# Patient Record
Sex: Female | Born: 1955
Health system: Southern US, Community
[De-identification: ages and names within clinical notes are randomized; demographics above are authoritative.]

## PROBLEM LIST (undated history)

## (undated) DIAGNOSIS — H409 Unspecified glaucoma: Secondary | ICD-10-CM

## (undated) DIAGNOSIS — I10 Essential (primary) hypertension: Secondary | ICD-10-CM

## (undated) HISTORY — PX: TUBAL LIGATION: SHX77

---

## 2001-06-06 ENCOUNTER — Other Ambulatory Visit: Admission: RE | Admit: 2001-06-06 | Discharge: 2001-06-06 | Payer: Self-pay | Admitting: Family Medicine

## 2005-10-03 ENCOUNTER — Other Ambulatory Visit: Admission: RE | Admit: 2005-10-03 | Discharge: 2005-10-03 | Payer: Self-pay | Admitting: Family Medicine

## 2009-01-06 ENCOUNTER — Other Ambulatory Visit: Admission: RE | Admit: 2009-01-06 | Discharge: 2009-01-06 | Payer: Self-pay | Admitting: Family Medicine

## 2010-12-12 ENCOUNTER — Encounter: Payer: Self-pay | Admitting: Family Medicine

## 2014-02-19 DEATH — deceased

## 2014-07-22 ENCOUNTER — Other Ambulatory Visit: Payer: Self-pay | Admitting: Family Medicine

## 2014-07-22 DIAGNOSIS — Z1231 Encounter for screening mammogram for malignant neoplasm of breast: Secondary | ICD-10-CM

## 2014-08-05 ENCOUNTER — Ambulatory Visit: Payer: Self-pay

## 2014-08-19 ENCOUNTER — Ambulatory Visit
Admission: RE | Admit: 2014-08-19 | Discharge: 2014-08-19 | Disposition: A | Discharge status: Home / Self Care | Payer: Self-pay | Source: Ambulatory Visit | Attending: Family Medicine | Admitting: Family Medicine

## 2014-08-19 DIAGNOSIS — Z1231 Encounter for screening mammogram for malignant neoplasm of breast: Secondary | ICD-10-CM

## 2019-10-28 DIAGNOSIS — C44319 Basal cell carcinoma of skin of other parts of face: Secondary | ICD-10-CM | POA: Diagnosis not present

## 2019-10-28 DIAGNOSIS — L821 Other seborrheic keratosis: Secondary | ICD-10-CM | POA: Diagnosis not present

## 2019-10-28 DIAGNOSIS — L570 Actinic keratosis: Secondary | ICD-10-CM | POA: Diagnosis not present

## 2019-10-28 DIAGNOSIS — L218 Other seborrheic dermatitis: Secondary | ICD-10-CM | POA: Diagnosis not present

## 2019-10-28 DIAGNOSIS — D485 Neoplasm of uncertain behavior of skin: Secondary | ICD-10-CM | POA: Diagnosis not present

## 2019-10-28 DIAGNOSIS — D1801 Hemangioma of skin and subcutaneous tissue: Secondary | ICD-10-CM | POA: Diagnosis not present

## 2019-11-06 ENCOUNTER — Ambulatory Visit (INDEPENDENT_AMBULATORY_CARE_PROVIDER_SITE_OTHER): Payer: PPO

## 2019-11-06 ENCOUNTER — Ambulatory Visit (INDEPENDENT_AMBULATORY_CARE_PROVIDER_SITE_OTHER): Payer: PPO | Admitting: Orthopaedic Surgery

## 2019-11-06 ENCOUNTER — Other Ambulatory Visit: Payer: Self-pay

## 2019-11-06 ENCOUNTER — Encounter: Payer: Self-pay | Admitting: Orthopaedic Surgery

## 2019-11-06 VITALS — Ht 62.0 in | Wt 200.0 lb

## 2019-11-06 DIAGNOSIS — M79605 Pain in left leg: Secondary | ICD-10-CM

## 2019-11-06 DIAGNOSIS — M25551 Pain in right hip: Secondary | ICD-10-CM | POA: Diagnosis not present

## 2019-11-06 DIAGNOSIS — M79604 Pain in right leg: Secondary | ICD-10-CM | POA: Diagnosis not present

## 2019-11-06 DIAGNOSIS — M25552 Pain in left hip: Secondary | ICD-10-CM

## 2019-11-06 MED ORDER — MELOXICAM 7.5 MG PO TABS: 7.5000 mg | ORAL_TABLET | Freq: Two times a day (BID) | ORAL | 3 refills | Status: DC | PRN

## 2019-11-06 MED ORDER — METHYLPREDNISOLONE 4 MG PO TABS: ORAL_TABLET | ORAL | 0 refills | Status: DC

## 2019-11-06 NOTE — Progress Notes (Signed)
Office Visit Note   Patient: Helen Martin           Date of Birth: 1956-05-01           MRN: [id number] Visit Date: 11/06/2019              Requested by: No referring provider defined for this encounter. PCP: System, Pcp Not In   Assessment & Plan: Visit Diagnoses:  1. Pain in left hip   2. Pain in right hip   3. Bilateral leg pain     Plan: From a hip standpoint, I believe her main diagnosis is trochanteric bursitis on the right side.  I explained what this means and detail.  I showed her some stretching exercises to try which she demonstrated them back to me.  I recommended anti-inflammatories as well as a topical Voltaren gel to try.  My neck step would be considering a steroid injection on the right side and even formal physical therapy if he gets to that point.  She said she will call if other modalities or not helping her and she needs something more like an injection or therapy.  All question concerns were answered and addressed.  Follow-up as otherwise as needed.  Follow-Up Instructions: Return if symptoms worsen or fail to improve.   Orders:  Orders Placed This Encounter  Procedures  . XR HIPS BILAT W OR W/O PELVIS 2V  . XR Lumbar Spine 2-3 Views   Meds ordered this encounter  Medications  . methylPREDNISolone (MEDROL) 4 MG tablet    Sig: Medrol dose pack. Take as instructed    Dispense:  21 tablet    Refill:  0  . meloxicam (MOBIC) 7.5 MG tablet    Sig: Take 1 tablet (7.5 mg total) by mouth 2 (two) times daily between meals as needed for pain.    Dispense:  60 tablet    Refill:  3      Procedures: No procedures performed   Clinical Data: No additional findings.   Subjective: Chief Complaint  Patient presents with  . Left Hip - Pain  . Right Hip - Pain  Patient is a very pleasant 63 year old female who comes in with bilateral hip pain with the right worse than left for approximately a year now.  She says it aches a lot when she is sitting for a  while or at bedtime.  She denies any groin pain.  She has a little bit of lower back pain and ache.  She worked on [employer] for about 50 years.  She is a side sleeper and says her right hip hurts quite a bit when she sleeps on the side.  She only takes Tylenol PM.  She denies any weakness in her legs and denies any change in bowel bladder function.  She is someone who has a BMI of almost 37.  She is not a diabetic and not a smoker.  HPI  Review of Systems She currently denies any headache, chest pain, shortness of breath, fever, chills, nausea, vomiting  Objective: Vital Signs: Ht 5\' 2"  (1.575 m)   Wt 200 lb (90.7 kg)   BMI 36.58 kg/m   Physical Exam She is alert and orient x3 and in no acute distress Ortho Exam Examination of both hips are normal in terms of full range of motion of both hips with no pain in the groin at all.  She has pain to palpation of the trochanteric area of both hips with  the right worse than left.  She does have some low back pain to the right and left sides of the lower aspect of her lumbar spine. Specialty Comments:  No specialty comments available.  Imaging: XR HIPS BILAT W OR W/O PELVIS 2V  Result Date: 11/06/2019 An AP pelvis and lateral of the left hip and right hip shows no significant arthritic changes at all.  Both hips are concentric and well located.  There are bone spurs noted around the trochanteric area on the right hip.  XR Lumbar Spine 2-3 Views  Result Date: 11/06/2019 2 views of the lumbar spine show a quite significant degenerative scoliosis with arthritic changes at multiple levels.    PMFS History: There are no problems to display for this patient.  History reviewed. No pertinent past medical history.  History reviewed. No pertinent family history.  History reviewed. No pertinent surgical history. Social History   Occupational History  . Not on file  Tobacco Use  . Smoking status: Not on file  Substance and Sexual Activity   . Alcohol use: Not on file  . Drug use: Not on file  . Sexual activity: Not on file

## 2019-12-02 DIAGNOSIS — H9313 Tinnitus, bilateral: Secondary | ICD-10-CM | POA: Diagnosis not present

## 2019-12-02 DIAGNOSIS — H903 Sensorineural hearing loss, bilateral: Secondary | ICD-10-CM | POA: Diagnosis not present

## 2020-01-01 DIAGNOSIS — H903 Sensorineural hearing loss, bilateral: Secondary | ICD-10-CM | POA: Diagnosis not present

## 2020-01-14 ENCOUNTER — Other Ambulatory Visit: Payer: Self-pay | Admitting: Otolaryngology

## 2020-01-14 DIAGNOSIS — H903 Sensorineural hearing loss, bilateral: Secondary | ICD-10-CM

## 2020-01-17 DIAGNOSIS — Z1159 Encounter for screening for other viral diseases: Secondary | ICD-10-CM | POA: Diagnosis not present

## 2020-01-17 DIAGNOSIS — I10 Essential (primary) hypertension: Secondary | ICD-10-CM | POA: Diagnosis not present

## 2020-01-17 DIAGNOSIS — Z23 Encounter for immunization: Secondary | ICD-10-CM | POA: Diagnosis not present

## 2020-01-17 DIAGNOSIS — I8393 Asymptomatic varicose veins of bilateral lower extremities: Secondary | ICD-10-CM | POA: Diagnosis not present

## 2020-01-17 DIAGNOSIS — Z136 Encounter for screening for cardiovascular disorders: Secondary | ICD-10-CM | POA: Diagnosis not present

## 2020-01-17 DIAGNOSIS — Z1239 Encounter for other screening for malignant neoplasm of breast: Secondary | ICD-10-CM | POA: Diagnosis not present

## 2020-01-17 DIAGNOSIS — Z0000 Encounter for general adult medical examination without abnormal findings: Secondary | ICD-10-CM | POA: Diagnosis not present

## 2020-01-21 ENCOUNTER — Other Ambulatory Visit: Payer: Self-pay | Admitting: Family Medicine

## 2020-01-21 DIAGNOSIS — Z1231 Encounter for screening mammogram for malignant neoplasm of breast: Secondary | ICD-10-CM

## 2020-02-05 ENCOUNTER — Other Ambulatory Visit: Payer: Self-pay

## 2020-02-05 ENCOUNTER — Ambulatory Visit
Admission: RE | Admit: 2020-02-05 | Discharge: 2020-02-05 | Disposition: A | Discharge status: Home / Self Care | Payer: PPO | Source: Ambulatory Visit | Attending: Otolaryngology | Admitting: Otolaryngology

## 2020-02-05 DIAGNOSIS — H903 Sensorineural hearing loss, bilateral: Secondary | ICD-10-CM

## 2020-02-05 DIAGNOSIS — H9191 Unspecified hearing loss, right ear: Secondary | ICD-10-CM | POA: Diagnosis not present

## 2020-02-05 MED ORDER — GADOBENATE DIMEGLUMINE 529 MG/ML IV SOLN
19.0000 mL | Freq: Once | INTRAVENOUS | Status: AC | PRN
  Administered 2020-02-05: 19 mL via INTRAVENOUS

## 2020-02-10 DIAGNOSIS — H9041 Sensorineural hearing loss, unilateral, right ear, with unrestricted hearing on the contralateral side: Secondary | ICD-10-CM | POA: Diagnosis not present

## 2020-02-12 DIAGNOSIS — Z1159 Encounter for screening for other viral diseases: Secondary | ICD-10-CM | POA: Diagnosis not present

## 2020-02-17 DIAGNOSIS — Z86010 Personal history of colonic polyps: Secondary | ICD-10-CM | POA: Diagnosis not present

## 2020-02-17 DIAGNOSIS — K5730 Diverticulosis of large intestine without perforation or abscess without bleeding: Secondary | ICD-10-CM | POA: Diagnosis not present

## 2020-02-24 ENCOUNTER — Other Ambulatory Visit: Payer: Self-pay

## 2020-02-24 ENCOUNTER — Ambulatory Visit
Admission: RE | Admit: 2020-02-24 | Discharge: 2020-02-24 | Disposition: A | Discharge status: Home / Self Care | Payer: PPO | Source: Ambulatory Visit | Attending: Family Medicine | Admitting: Family Medicine

## 2020-02-24 DIAGNOSIS — Z1231 Encounter for screening mammogram for malignant neoplasm of breast: Secondary | ICD-10-CM

## 2020-03-25 DIAGNOSIS — I83891 Varicose veins of right lower extremities with other complications: Secondary | ICD-10-CM | POA: Diagnosis not present

## 2020-06-30 IMAGING — MR MR BRAIN/IAC WO/W CM
11 of 12 series · 39 of 48 positions shown · IV contrast (multihance)
Comparison: None.

CLINICAL DATA: Right ear hearing loss for 9 months.

EXAM:
MRI HEAD WITHOUT AND WITH CONTRAST
TECHNIQUE: Multiplanar, multiecho pulse sequences of the brain and surrounding
structures were obtained without and with intravenous contrast.
CONTRAST:  19mL MULTIHANCE GADOBENATE DIMEGLUMINE 529 MG/ML IV SOLN

[Series 2: T1 · sagittal · 5.0mm · 0.45mm/px · 4 of 22 slices shown (1 of 3)]
[im 1/22]
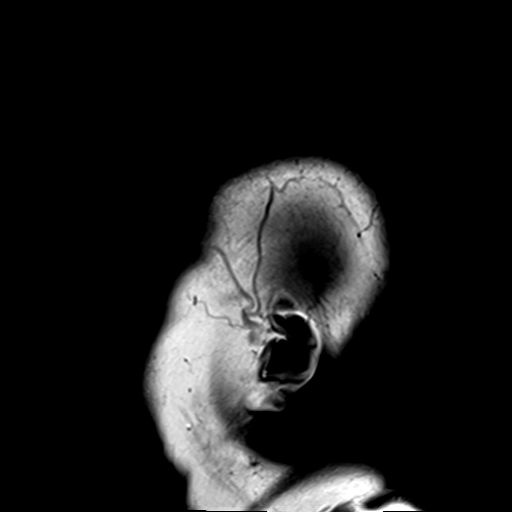
[im 8/22]
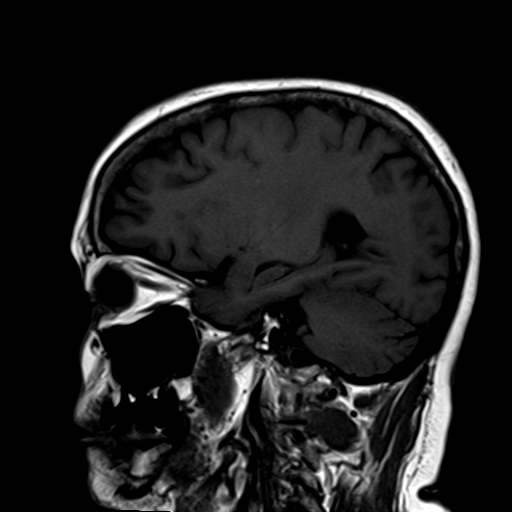
[im 15/22]
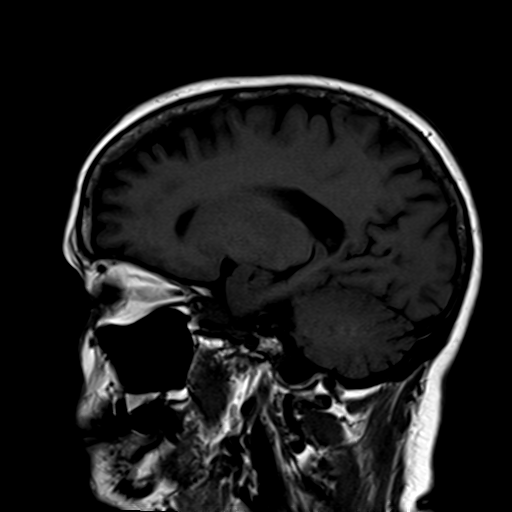
[im 22/22]
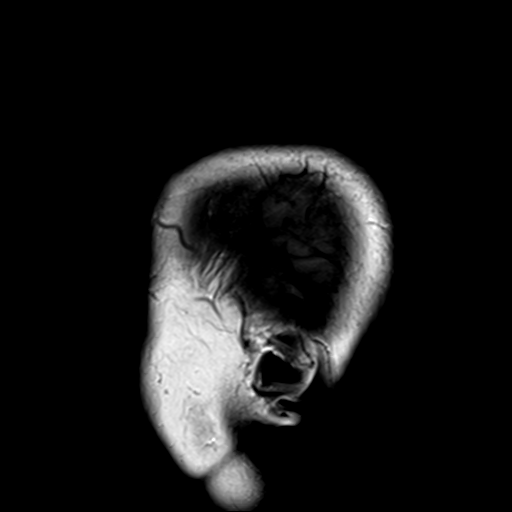

[Series 3: DWI · axial · 3.0mm · 1.80mm/px · z∈[-66,+80]mm · 11 of 100 slices shown (1 of 2)]
[im 1/100]
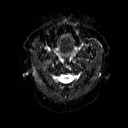
[im 10/100]
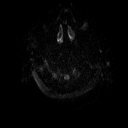
[im 20/100]
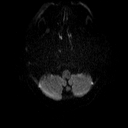
[im 30/100]
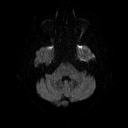
[im 40/100]
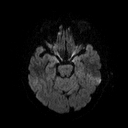
[im 50/100]
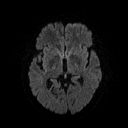
[im 60/100]
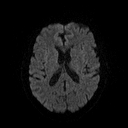
[im 70/100]
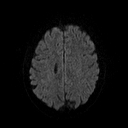
[im 80/100]
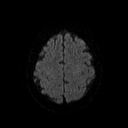
[im 90/100]
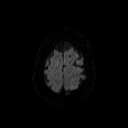
[im 100/100]
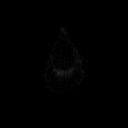

[Series 4: DWI · axial · 3.0mm · 1.80mm/px · z∈[-66,+80]mm · 6 of 50 slices shown (2 of 2)]
[im 1/50]
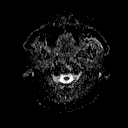
[im 10/50]
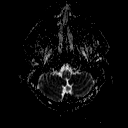
[im 20/50]
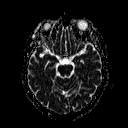
[im 30/50]
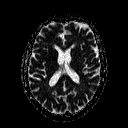
[im 40/50]
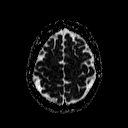
[im 50/50]
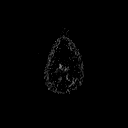

[Series 5: T2 · axial · 5.0mm · 0.45mm/px · z∈[-63,+79]mm · 3 of 23 slices shown]
[im 1/23]
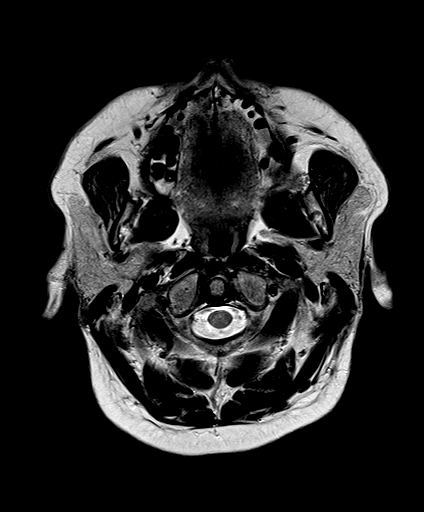
[im 12/23]
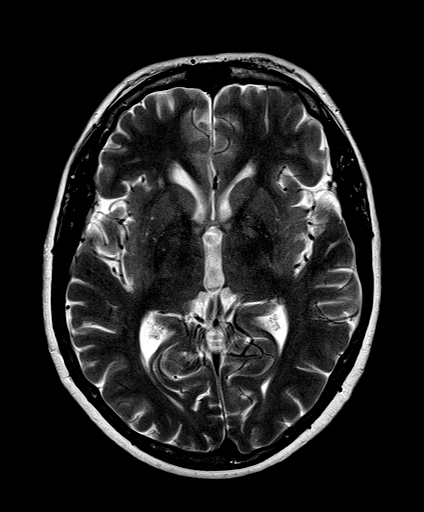
[im 23/23]
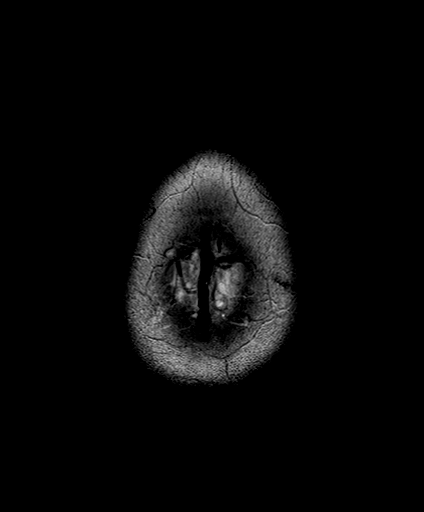

[Series 6: FLAIR · axial · 3.0mm · 0.45mm/px · z∈[-65,+78]mm · 3 of 25 slices shown]
[im 1/25]
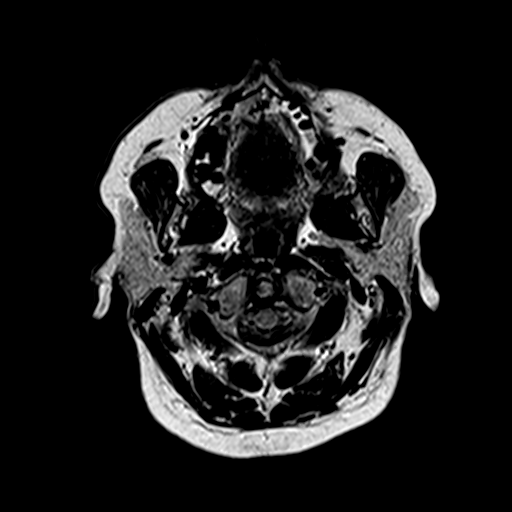
[im 13/25]
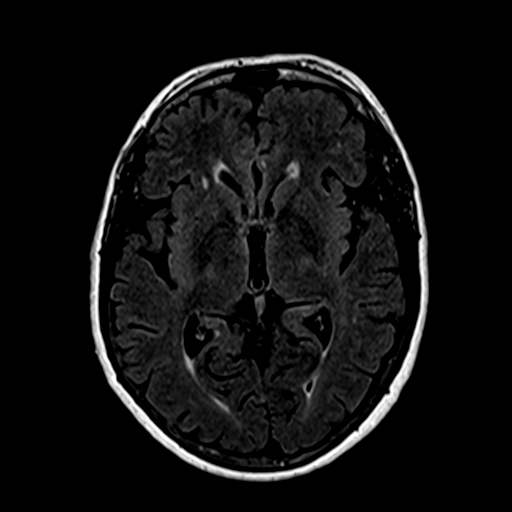
[im 25/25]
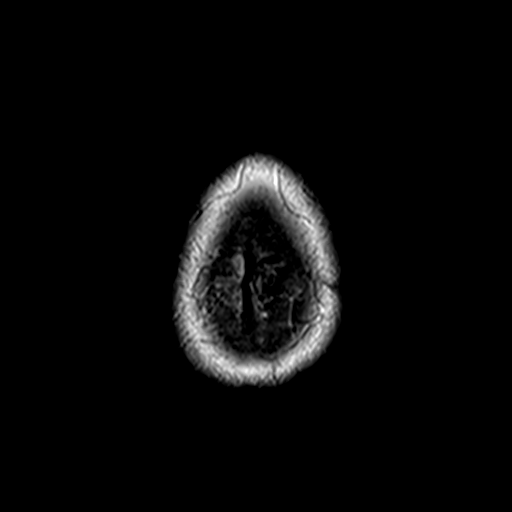

[Series 8: swi_images · axial · 3.0mm · 0.90mm/px · z∈[-60,+80]mm · 5 of 48 slices shown]
[im 1/48]
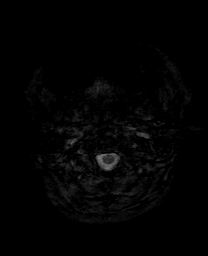
[im 12/48]
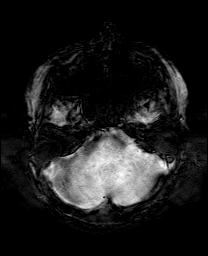
[im 24/48]
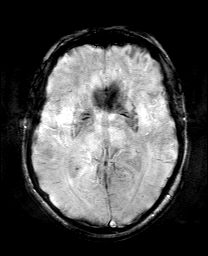
[im 36/48]
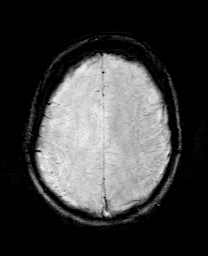
[im 48/48]
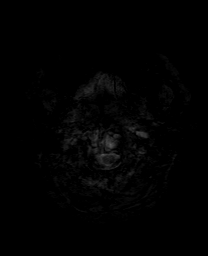

[Series 9: T1 · coronal · 3.0mm · 0.35mm/px · 1 of 11 slices shown (2 of 3)]
[im 1/11]
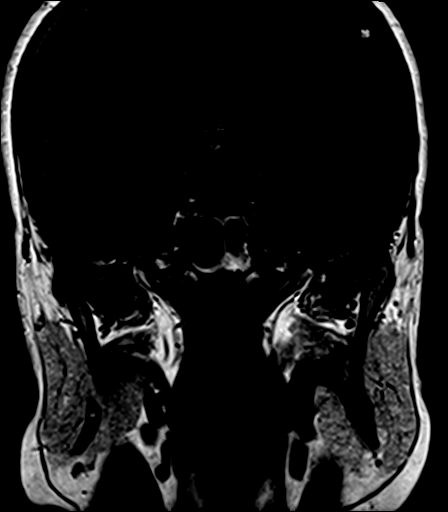

[Series 10: T1 · axial · 3.0mm · 0.35mm/px · 1 of 11 slices shown (3 of 3)]
[im 1/11]
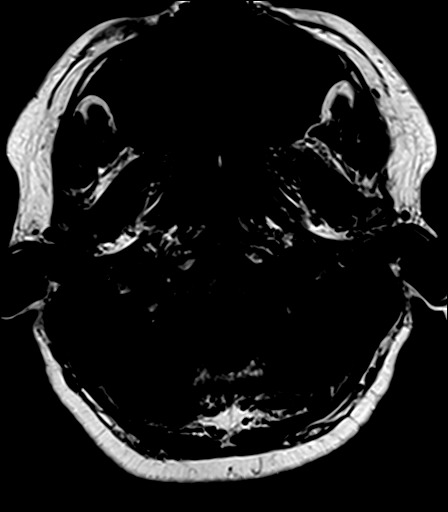

[Series 11: bSSFP · axial · 1.0mm · 0.28mm/px · z∈[-31,-8]mm · 3 of 36 slices shown]
[im 1/36]
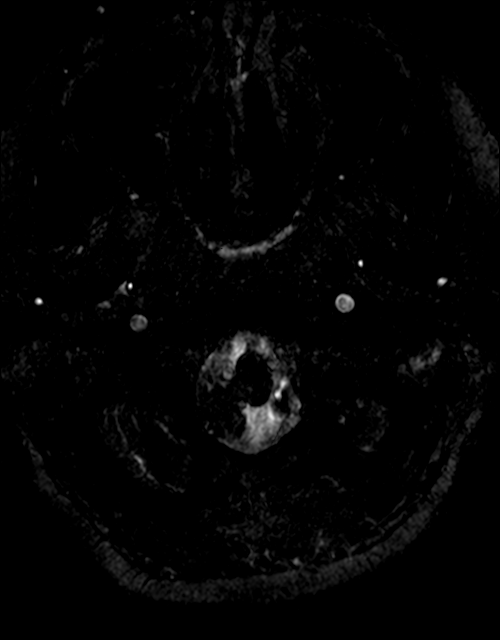
[im 12/36]
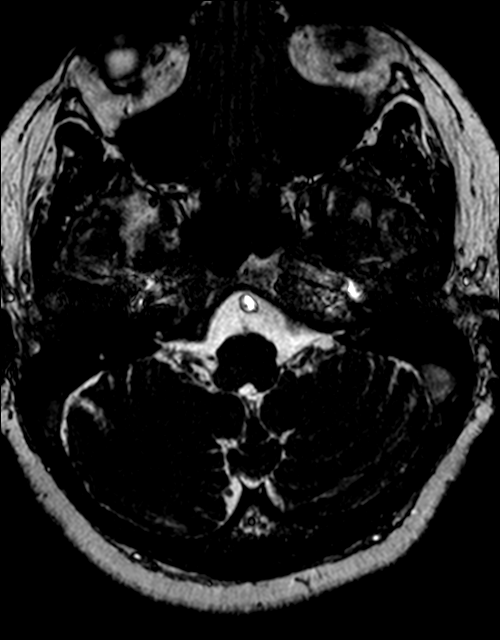
[im 24/36]
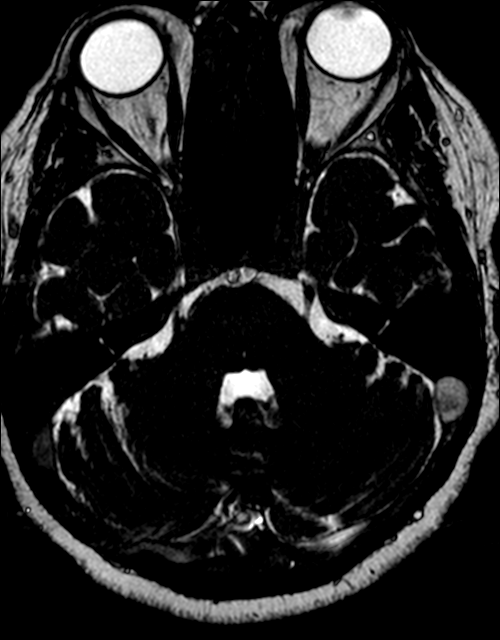

[Series 12: T1 post-contrast · coronal · 3.0mm · 0.35mm/px · 1 of 11 slices shown (1 of 2)]
[im 1/11]
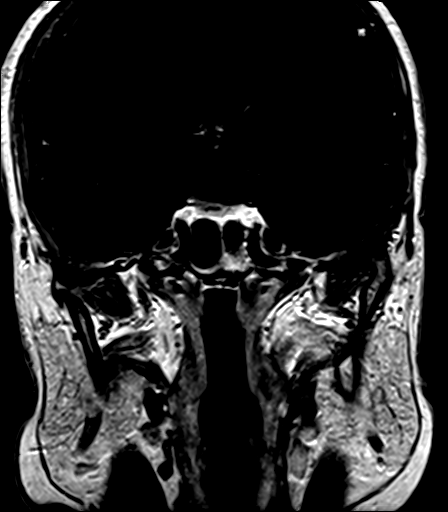

[Series 13: T1 post-contrast · axial · 3.0mm · 0.35mm/px · 1 of 11 slices shown (2 of 2)]
[im 1/11]
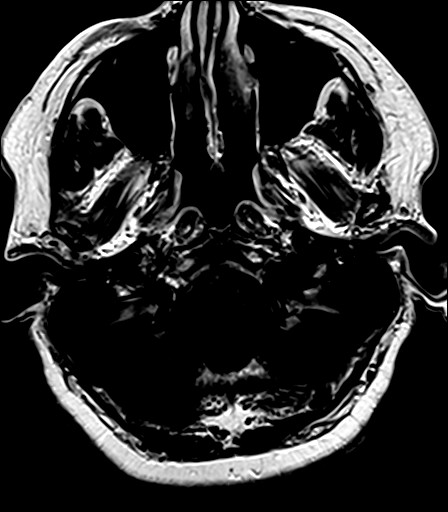

[39 of 48 positions shown; findings below may reference images not displayed]

FINDINGS: Brain: No acute infarct, acute hemorrhage or extra-axial collection.
Normal white matter signal. Normal volume of CSF spaces. No chronic
microhemorrhage. Normal midline structures. There is no
cerebellopontine angle mass. The cochleae and semicircular canals
are normal. No focal abnormality along the course of the 7th and 8th
cranial nerves. Normal porus acusticus and vestibular aqueduct
bilaterally.

Vascular: Normal flow voids.

Skull and upper cervical spine: Normal marrow signal.

Sinuses/Orbits: Negative.

Other: None.
IMPRESSION: Normal MRI of the brain and internal auditory canals.

## 2020-07-08 DIAGNOSIS — I83891 Varicose veins of right lower extremities with other complications: Secondary | ICD-10-CM | POA: Diagnosis not present

## 2020-07-15 DIAGNOSIS — I83891 Varicose veins of right lower extremities with other complications: Secondary | ICD-10-CM | POA: Diagnosis not present

## 2020-07-15 DIAGNOSIS — I8393 Asymptomatic varicose veins of bilateral lower extremities: Secondary | ICD-10-CM | POA: Diagnosis not present

## 2021-02-01 DIAGNOSIS — I10 Essential (primary) hypertension: Secondary | ICD-10-CM | POA: Diagnosis not present

## 2021-02-01 DIAGNOSIS — Z0000 Encounter for general adult medical examination without abnormal findings: Secondary | ICD-10-CM | POA: Diagnosis not present

## 2021-05-10 DIAGNOSIS — I10 Essential (primary) hypertension: Secondary | ICD-10-CM | POA: Diagnosis not present

## 2021-07-29 DIAGNOSIS — S6291XA Unspecified fracture of right wrist and hand, initial encounter for closed fracture: Secondary | ICD-10-CM | POA: Diagnosis not present

## 2021-07-29 DIAGNOSIS — S5291XA Unspecified fracture of right forearm, initial encounter for closed fracture: Secondary | ICD-10-CM | POA: Diagnosis not present

## 2021-08-09 DIAGNOSIS — H903 Sensorineural hearing loss, bilateral: Secondary | ICD-10-CM | POA: Diagnosis not present

## 2021-10-05 DIAGNOSIS — H524 Presbyopia: Secondary | ICD-10-CM | POA: Diagnosis not present

## 2021-10-05 DIAGNOSIS — H401131 Primary open-angle glaucoma, bilateral, mild stage: Secondary | ICD-10-CM | POA: Diagnosis not present

## 2021-10-05 DIAGNOSIS — H2513 Age-related nuclear cataract, bilateral: Secondary | ICD-10-CM | POA: Diagnosis not present

## 2021-11-08 DIAGNOSIS — H401131 Primary open-angle glaucoma, bilateral, mild stage: Secondary | ICD-10-CM | POA: Diagnosis not present

## 2022-02-07 DIAGNOSIS — H401131 Primary open-angle glaucoma, bilateral, mild stage: Secondary | ICD-10-CM | POA: Diagnosis not present

## 2022-02-09 DIAGNOSIS — I10 Essential (primary) hypertension: Secondary | ICD-10-CM | POA: Diagnosis not present

## 2022-02-09 DIAGNOSIS — Z23 Encounter for immunization: Secondary | ICD-10-CM | POA: Diagnosis not present

## 2022-02-09 DIAGNOSIS — Z0000 Encounter for general adult medical examination without abnormal findings: Secondary | ICD-10-CM | POA: Diagnosis not present

## 2022-05-16 DIAGNOSIS — H401131 Primary open-angle glaucoma, bilateral, mild stage: Secondary | ICD-10-CM | POA: Diagnosis not present

## 2022-06-20 DIAGNOSIS — I10 Essential (primary) hypertension: Secondary | ICD-10-CM | POA: Diagnosis not present

## 2022-08-16 DIAGNOSIS — H401131 Primary open-angle glaucoma, bilateral, mild stage: Secondary | ICD-10-CM | POA: Diagnosis not present

## 2022-11-28 DIAGNOSIS — H903 Sensorineural hearing loss, bilateral: Secondary | ICD-10-CM | POA: Diagnosis not present

## 2022-12-08 DIAGNOSIS — H401131 Primary open-angle glaucoma, bilateral, mild stage: Secondary | ICD-10-CM | POA: Diagnosis not present

## 2022-12-08 DIAGNOSIS — H2513 Age-related nuclear cataract, bilateral: Secondary | ICD-10-CM | POA: Diagnosis not present

## 2022-12-08 DIAGNOSIS — H524 Presbyopia: Secondary | ICD-10-CM | POA: Diagnosis not present

## 2023-01-31 ENCOUNTER — Other Ambulatory Visit: Payer: Self-pay | Admitting: Family Medicine

## 2023-01-31 DIAGNOSIS — Z1231 Encounter for screening mammogram for malignant neoplasm of breast: Secondary | ICD-10-CM

## 2023-02-20 DIAGNOSIS — Z23 Encounter for immunization: Secondary | ICD-10-CM | POA: Diagnosis not present

## 2023-02-20 DIAGNOSIS — R7301 Impaired fasting glucose: Secondary | ICD-10-CM | POA: Diagnosis not present

## 2023-02-20 DIAGNOSIS — I10 Essential (primary) hypertension: Secondary | ICD-10-CM | POA: Diagnosis not present

## 2023-02-20 DIAGNOSIS — Z0000 Encounter for general adult medical examination without abnormal findings: Secondary | ICD-10-CM | POA: Diagnosis not present

## 2023-03-13 DIAGNOSIS — H401131 Primary open-angle glaucoma, bilateral, mild stage: Secondary | ICD-10-CM | POA: Diagnosis not present

## 2023-03-27 ENCOUNTER — Ambulatory Visit
Admission: RE | Admit: 2023-03-27 | Discharge: 2023-03-27 | Disposition: A | Discharge status: Home / Self Care | Payer: PPO | Source: Ambulatory Visit | Attending: Family Medicine | Admitting: Family Medicine

## 2023-03-27 DIAGNOSIS — Z1231 Encounter for screening mammogram for malignant neoplasm of breast: Secondary | ICD-10-CM

## 2023-03-27 DIAGNOSIS — H6121 Impacted cerumen, right ear: Secondary | ICD-10-CM | POA: Diagnosis not present

## 2023-03-27 DIAGNOSIS — H903 Sensorineural hearing loss, bilateral: Secondary | ICD-10-CM | POA: Diagnosis not present

## 2023-03-27 DIAGNOSIS — H9201 Otalgia, right ear: Secondary | ICD-10-CM | POA: Diagnosis not present

## 2023-04-16 ENCOUNTER — Emergency Department (HOSPITAL_BASED_OUTPATIENT_CLINIC_OR_DEPARTMENT_OTHER)
Admission: EM | Admit: 2023-04-16 | Discharge: 2023-04-16 | Disposition: A | Discharge status: Home / Self Care | Payer: PPO | Attending: Emergency Medicine | Admitting: Emergency Medicine

## 2023-04-16 ENCOUNTER — Encounter (HOSPITAL_BASED_OUTPATIENT_CLINIC_OR_DEPARTMENT_OTHER): Payer: Self-pay | Admitting: *Deleted

## 2023-04-16 ENCOUNTER — Other Ambulatory Visit: Payer: Self-pay

## 2023-04-16 DIAGNOSIS — H60502 Unspecified acute noninfective otitis externa, left ear: Secondary | ICD-10-CM | POA: Diagnosis not present

## 2023-04-16 DIAGNOSIS — H9202 Otalgia, left ear: Secondary | ICD-10-CM | POA: Diagnosis present

## 2023-04-16 DIAGNOSIS — H6092 Unspecified otitis externa, left ear: Secondary | ICD-10-CM | POA: Diagnosis not present

## 2023-04-16 HISTORY — DX: Unspecified glaucoma: H40.9

## 2023-04-16 HISTORY — DX: Essential (primary) hypertension: I10

## 2023-04-16 MED ORDER — AMOXICILLIN-POT CLAVULANATE 875-125 MG PO TABS: 1.0000 | ORAL_TABLET | Freq: Two times a day (BID) | ORAL | 0 refills | Status: AC

## 2023-04-16 MED ORDER — CIPROFLOXACIN-DEXAMETHASONE 0.3-0.1 % OT SUSP
4.0000 [drp] | Freq: Once | OTIC | Status: AC
  Administered 2023-04-16: 4 [drp] via OTIC
  Filled 2023-04-16: qty 7.5

## 2023-04-16 MED ORDER — AMOXICILLIN-POT CLAVULANATE 875-125 MG PO TABS
1.0000 | ORAL_TABLET | Freq: Once | ORAL | Status: AC
  Administered 2023-04-16: 1 via ORAL
  Filled 2023-04-16: qty 1

## 2023-04-16 NOTE — ED Provider Notes (Signed)
   [facility] EMERGENCY DEPARTMENT AT [facility]  Provider Note  CSN: [id number] Arrival date & time: 04/16/23 0158  History Chief Complaint  Patient presents with   Otalgia    Helen Martin is a 67 y.o. female with history of hearing loss wears hearing aids bilaterally reports 2 days of increasing pain and worsening hearing in L ear. Some popping sounds. No drainage. No fever. No recent illness.    Home Medications Prior to Admission medications   Medication Sig Start Date End Date Taking? Authorizing Provider  amoxicillin-clavulanate (AUGMENTIN) 875-125 MG tablet Take 1 tablet by mouth every 12 (twelve) hours. 04/16/23  Yes Pollyann Savoy, MD  latanoprost (XALATAN) 0.005 % ophthalmic solution Place 1 drop into the left eye at bedtime.   Yes [provider]  lisinopril (ZESTRIL) 10 MG tablet Take 10 mg by mouth daily.   Yes [provider]     Allergies    Patient has no known allergies.   Review of Systems   Review of Systems Please see HPI for pertinent positives and negatives  Physical Exam BP (!) 163/85   Pulse 75   Temp 97.9 F (36.6 C) (Oral)   Resp 18   Ht 5\' 1"  (1.549 m)   Wt 86.2 kg   SpO2 98%   BMI 35.90 kg/m   Physical Exam Vitals and nursing note reviewed.  HENT:     Head: Normocephalic.     Right Ear: Tympanic membrane and ear canal normal.     Left Ear: Swelling (otitis externa, moderate edema of canal precluded visualization of TM) present.     Nose: Nose normal.  Eyes:     Extraocular Movements: Extraocular movements intact.  Pulmonary:     Effort: Pulmonary effort is normal.  Musculoskeletal:        General: Normal range of motion.     Cervical back: Neck supple.  Skin:    Findings: No rash (on exposed skin).  Neurological:     Mental Status: She is alert and oriented to person, place, and time.  Psychiatric:        Mood and Affect: Mood normal.     ED Results / Procedures / Treatments    EKG None  Procedures Procedures  Medications Ordered in the ED Medications  ciprofloxacin-dexamethasone (CIPRODEX) 0.3-0.1 % OTIC (EAR) suspension 4 drop (has no administration in time range)  amoxicillin-clavulanate (AUGMENTIN) 875-125 MG per tablet 1 tablet (has no administration in time range)    Initial Impression and Plan  Patient here with otitis externa of L ear. An Earwick was placed due to the degree of swelling. Plan oral and topical Abx. Given Ciprodex in the ED.   ED Course       MDM Rules/Calculators/A&P Medical Decision Making Problems Addressed: Acute otitis externa of left ear, unspecified type: acute illness or injury  Risk Prescription drug management.     Final Clinical Impression(s) / ED Diagnoses Final diagnoses:  Acute otitis externa of left ear, unspecified type    Rx / DC Orders ED Discharge Orders          Ordered    amoxicillin-clavulanate (AUGMENTIN) 875-125 MG tablet  Every 12 hours        04/16/23 0257             Pollyann Savoy, MD 04/16/23 [phone]

## 2023-04-16 NOTE — Discharge Instructions (Addendum)
Take your oral antibiotics as prescribed. Use the ear drop antibiotics 4 drops twice a day. The ear wick sponge should fall out in the next 48 hours, but continue to use the drops for a week

## 2023-04-16 NOTE — ED Triage Notes (Addendum)
Pt c/o of left ear pain that started on Friday. States she is not able to hear out of the ear. States left outer ear is very tender to touch. States that she is hoh and wears hearing aids. States left ear keeps popping. States ears feels ears are "stopped up bilateral" no cough, fever, or any other symptoms. Took tylenol at 1am.

## 2023-04-21 DIAGNOSIS — H60502 Unspecified acute noninfective otitis externa, left ear: Secondary | ICD-10-CM | POA: Diagnosis not present

## 2023-05-01 DIAGNOSIS — E1159 Type 2 diabetes mellitus with other circulatory complications: Secondary | ICD-10-CM | POA: Diagnosis not present

## 2023-05-01 DIAGNOSIS — E119 Type 2 diabetes mellitus without complications: Secondary | ICD-10-CM | POA: Diagnosis not present

## 2023-06-27 DIAGNOSIS — H524 Presbyopia: Secondary | ICD-10-CM | POA: Diagnosis not present

## 2023-06-27 DIAGNOSIS — H0100A Unspecified blepharitis right eye, upper and lower eyelids: Secondary | ICD-10-CM | POA: Diagnosis not present

## 2023-06-27 DIAGNOSIS — H5212 Myopia, left eye: Secondary | ICD-10-CM | POA: Diagnosis not present

## 2023-06-27 DIAGNOSIS — H2513 Age-related nuclear cataract, bilateral: Secondary | ICD-10-CM | POA: Diagnosis not present

## 2023-06-27 DIAGNOSIS — H43813 Vitreous degeneration, bilateral: Secondary | ICD-10-CM | POA: Diagnosis not present

## 2023-06-27 DIAGNOSIS — H0100B Unspecified blepharitis left eye, upper and lower eyelids: Secondary | ICD-10-CM | POA: Diagnosis not present

## 2023-06-27 DIAGNOSIS — H40023 Open angle with borderline findings, high risk, bilateral: Secondary | ICD-10-CM | POA: Diagnosis not present

## 2023-06-27 DIAGNOSIS — H25013 Cortical age-related cataract, bilateral: Secondary | ICD-10-CM | POA: Diagnosis not present

## 2023-08-17 DIAGNOSIS — M25562 Pain in left knee: Secondary | ICD-10-CM | POA: Diagnosis not present

## 2023-09-15 ENCOUNTER — Encounter (INDEPENDENT_AMBULATORY_CARE_PROVIDER_SITE_OTHER): Payer: Self-pay

## 2023-09-15 ENCOUNTER — Ambulatory Visit (INDEPENDENT_AMBULATORY_CARE_PROVIDER_SITE_OTHER): Payer: PPO | Admitting: Otolaryngology

## 2023-09-15 VITALS — Ht 60.0 in | Wt 186.0 lb

## 2023-09-15 DIAGNOSIS — H90A21 Sensorineural hearing loss, unilateral, right ear, with restricted hearing on the contralateral side: Secondary | ICD-10-CM | POA: Diagnosis not present

## 2023-09-15 DIAGNOSIS — H6123 Impacted cerumen, bilateral: Secondary | ICD-10-CM | POA: Diagnosis not present

## 2023-09-18 DIAGNOSIS — H6123 Impacted cerumen, bilateral: Secondary | ICD-10-CM | POA: Insufficient documentation

## 2023-09-18 DIAGNOSIS — H90A21 Sensorineural hearing loss, unilateral, right ear, with restricted hearing on the contralateral side: Secondary | ICD-10-CM | POA: Insufficient documentation

## 2023-09-18 NOTE — Progress Notes (Signed)
Patient ID: Helen Martin, female   DOB: [birth year], 67 y.o.   MRN: [id number]  Follow-up: Right ear hearing loss, clogging sensation in ears  HPI: The patient is a 67 year old female who presents today complaining of clogging sensation in her ears.  She has been symptomatic for the past month.  The patient was last seen in May 2024.  At that time, she was noted to have asymmetric right ear hearing loss.  Her previous MRI scan was negative for retrocochlear lesion.  She was subsequently fitted with hearing aids.  According to the patient, she was doing well until recently, when she started noticing clogging sensation in her ears.  She denies any significant otalgia or otorrhea.  Exam: General: Communicates without difficulty, well nourished, no acute distress. Head: Normocephalic, no evidence injury, no tenderness, facial buttresses intact without stepoff. Face/sinus: No tenderness to palpation and percussion. Facial movement is normal and symmetric. Eyes: PERRL, EOMI. No scleral icterus, conjunctivae clear. Neuro: CN II exam reveals vision grossly intact.  No nystagmus at any point of gaze. Ears: Auricles well formed without lesions.  Bilateral cerumen impaction.  Nose: External evaluation reveals normal support and skin without lesions.  Dorsum is intact.  Anterior rhinoscopy reveals congested mucosa over anterior aspect of inferior turbinates and intact septum.  No purulence noted. Oral:  Oral cavity and oropharynx are intact, symmetric, without erythema or edema.  Mucosa is moist without lesions. Neck: Full range of motion without pain.  There is no significant lymphadenopathy.  No masses palpable.  Thyroid bed within normal limits to palpation.  Parotid glands and submandibular glands equal bilaterally without mass.  Trachea is midline. Neuro:  CN 2-12 grossly intact.   Procedure: Bilateral cerumen disimpaction Anesthesia: None Description: Under the operating microscope, the cerumen is carefully  removed with a combination of cerumen currette, alligator forceps, and suction catheters.  After the cerumen is removed, the TMs are noted to be normal.  No mass, erythema, or lesions. The patient tolerated the procedure well.    Assessment: 1.  Bilateral cerumen impaction.  After the disimpaction procedure, both tympanic membranes and middle ear spaces are noted to be normal. 2.  The patient reports improvement in her hearing after the cerumen disimpaction procedure.  She reports her hearing is back to baseline.  She has asymmetric right ear sensorineural hearing loss.  Plan: 1.  Otomicroscopy with bilateral cerumen disimpaction. 2.  The physical exam findings are reviewed with the patient. 3.  Continue the use of her hearing aids. 4.  The patient will return for reevaluation in 6 months.

## 2023-10-11 DIAGNOSIS — H60392 Other infective otitis externa, left ear: Secondary | ICD-10-CM | POA: Diagnosis not present

## 2023-10-16 DIAGNOSIS — E1159 Type 2 diabetes mellitus with other circulatory complications: Secondary | ICD-10-CM | POA: Diagnosis not present

## 2023-10-16 DIAGNOSIS — I10 Essential (primary) hypertension: Secondary | ICD-10-CM | POA: Diagnosis not present

## 2023-10-16 DIAGNOSIS — E119 Type 2 diabetes mellitus without complications: Secondary | ICD-10-CM | POA: Diagnosis not present

## 2023-10-16 DIAGNOSIS — H6090 Unspecified otitis externa, unspecified ear: Secondary | ICD-10-CM | POA: Diagnosis not present

## 2024-02-07 DIAGNOSIS — R21 Rash and other nonspecific skin eruption: Secondary | ICD-10-CM | POA: Diagnosis not present

## 2024-03-04 DIAGNOSIS — Z0000 Encounter for general adult medical examination without abnormal findings: Secondary | ICD-10-CM | POA: Diagnosis not present

## 2024-03-04 DIAGNOSIS — E1159 Type 2 diabetes mellitus with other circulatory complications: Secondary | ICD-10-CM | POA: Diagnosis not present

## 2024-03-04 DIAGNOSIS — I10 Essential (primary) hypertension: Secondary | ICD-10-CM | POA: Diagnosis not present

## 2024-03-18 ENCOUNTER — Encounter (INDEPENDENT_AMBULATORY_CARE_PROVIDER_SITE_OTHER): Payer: Self-pay

## 2024-03-18 ENCOUNTER — Ambulatory Visit (INDEPENDENT_AMBULATORY_CARE_PROVIDER_SITE_OTHER): Payer: PPO | Admitting: Otolaryngology

## 2024-03-18 VITALS — BP 117/74 | HR 80

## 2024-03-18 DIAGNOSIS — H608X3 Other otitis externa, bilateral: Secondary | ICD-10-CM

## 2024-03-18 DIAGNOSIS — H6123 Impacted cerumen, bilateral: Secondary | ICD-10-CM

## 2024-03-18 DIAGNOSIS — H90A21 Sensorineural hearing loss, unilateral, right ear, with restricted hearing on the contralateral side: Secondary | ICD-10-CM | POA: Diagnosis not present

## 2024-03-18 MED ORDER — MOMETASONE FUROATE 0.1 % EX CREA: TOPICAL_CREAM | CUTANEOUS | 3 refills | Status: AC

## 2024-03-19 DIAGNOSIS — H608X3 Other otitis externa, bilateral: Secondary | ICD-10-CM | POA: Insufficient documentation

## 2024-03-19 NOTE — Progress Notes (Signed)
 Patient ID: Helen Martin, female   DOB: 1956-04-26, 68 y.o.   MRN: [id number]  Follow-up: Right ear hearing loss  HPI: The patient is a 68 year old female who returns today for her follow-up evaluation.  The patient has a history of asymmetric right ear hearing loss.  Her previous MRI scan was negative for retrocochlear lesion.  She was subsequently fitted with hearing aids.  The patient returns today reporting no significant change in her hearing.  However, she complains of frequent itchy sensation in her ears.  She also has a history of recurrent cerumen impaction.  Currently she denies any otalgia or otorrhea.  Exam: General: Communicates without difficulty, well nourished, no acute distress. Head: Normocephalic, no evidence injury, no tenderness, facial buttresses intact without stepoff. Face/sinus: No tenderness to palpation and percussion. Facial movement is normal and symmetric. Eyes: PERRL, EOMI. No scleral icterus, conjunctivae clear. Neuro: CN II exam reveals vision grossly intact.  No nystagmus at any point of gaze. Ears: Auricles well formed without lesions.  Bilateral cerumen impaction.  Nose: External evaluation reveals normal support and skin without lesions.  Dorsum is intact.  Anterior rhinoscopy reveals congested mucosa over anterior aspect of inferior turbinates and intact septum.  No purulence noted. Oral:  Oral cavity and oropharynx are intact, symmetric, without erythema or edema.  Mucosa is moist without lesions. Neck: Full range of motion without pain.  There is no significant lymphadenopathy.  No masses palpable.  Thyroid bed within normal limits to palpation.  Parotid glands and submandibular glands equal bilaterally without mass.  Trachea is midline. Neuro:  CN 2-12 grossly intact.    Procedure: Bilateral cerumen disimpaction Anesthesia: None Description: Under the operating microscope, the cerumen is carefully removed with a combination of cerumen currette, alligator forceps,  and suction catheters.  After the cerumen is removed, the TMs are noted to be normal.  No mass, erythema, or lesions.  Eczematous changes are noted within her ear canals.  The patient tolerated the procedure well.     Assessment: 1.  Bilateral cerumen impaction.  After the disimpaction procedure, both tympanic membranes and middle ear spaces are noted to be normal. 2.  Subjectively stable asymmetric right ear sensorineural hearing loss. 3.  Bilateral chronic eczematous otitis externa.   Plan: 1.  Otomicroscopy with bilateral cerumen disimpaction. 2.  The physical exam findings are reviewed with the patient. 3.  Continue the use of her hearing aids. 4.  Elocon cream to treat the chronic eczematous otitis externa. 5.  The patient will return for reevaluation in 6 months.

## 2024-03-25 ENCOUNTER — Other Ambulatory Visit: Payer: Self-pay | Admitting: Family Medicine

## 2024-03-25 DIAGNOSIS — Z1231 Encounter for screening mammogram for malignant neoplasm of breast: Secondary | ICD-10-CM

## 2024-03-27 DIAGNOSIS — H9212 Otorrhea, left ear: Secondary | ICD-10-CM | POA: Diagnosis not present

## 2024-04-01 ENCOUNTER — Ambulatory Visit
Admission: RE | Admit: 2024-04-01 | Discharge: 2024-04-01 | Disposition: A | Discharge status: Home / Self Care | Source: Ambulatory Visit | Attending: Family Medicine | Admitting: Family Medicine

## 2024-04-01 DIAGNOSIS — Z1231 Encounter for screening mammogram for malignant neoplasm of breast: Secondary | ICD-10-CM | POA: Diagnosis not present

## 2024-04-05 DIAGNOSIS — H9212 Otorrhea, left ear: Secondary | ICD-10-CM | POA: Diagnosis not present

## 2024-04-05 DIAGNOSIS — H903 Sensorineural hearing loss, bilateral: Secondary | ICD-10-CM | POA: Diagnosis not present

## 2024-05-02 DIAGNOSIS — H903 Sensorineural hearing loss, bilateral: Secondary | ICD-10-CM | POA: Diagnosis not present

## 2024-05-02 DIAGNOSIS — H9211 Otorrhea, right ear: Secondary | ICD-10-CM | POA: Diagnosis not present

## 2024-05-23 DIAGNOSIS — H903 Sensorineural hearing loss, bilateral: Secondary | ICD-10-CM | POA: Diagnosis not present

## 2024-06-12 DIAGNOSIS — Z6836 Body mass index (BMI) 36.0-36.9, adult: Secondary | ICD-10-CM | POA: Diagnosis not present

## 2024-06-12 DIAGNOSIS — H60502 Unspecified acute noninfective otitis externa, left ear: Secondary | ICD-10-CM | POA: Diagnosis not present

## 2024-06-27 ENCOUNTER — Ambulatory Visit (INDEPENDENT_AMBULATORY_CARE_PROVIDER_SITE_OTHER): Admitting: Otolaryngology

## 2024-06-27 ENCOUNTER — Encounter (INDEPENDENT_AMBULATORY_CARE_PROVIDER_SITE_OTHER): Payer: Self-pay | Admitting: Otolaryngology

## 2024-06-27 VITALS — BP 111/70 | HR 69 | Ht 60.0 in | Wt 183.0 lb

## 2024-06-27 DIAGNOSIS — H608X9 Other otitis externa, unspecified ear: Secondary | ICD-10-CM

## 2024-06-27 DIAGNOSIS — H25013 Cortical age-related cataract, bilateral: Secondary | ICD-10-CM | POA: Diagnosis not present

## 2024-06-27 DIAGNOSIS — L03211 Cellulitis of face: Secondary | ICD-10-CM | POA: Diagnosis not present

## 2024-06-27 DIAGNOSIS — H608X3 Other otitis externa, bilateral: Secondary | ICD-10-CM

## 2024-06-27 DIAGNOSIS — H2513 Age-related nuclear cataract, bilateral: Secondary | ICD-10-CM | POA: Diagnosis not present

## 2024-06-27 DIAGNOSIS — H40023 Open angle with borderline findings, high risk, bilateral: Secondary | ICD-10-CM | POA: Diagnosis not present

## 2024-06-29 DIAGNOSIS — L03211 Cellulitis of face: Secondary | ICD-10-CM | POA: Insufficient documentation

## 2024-06-29 NOTE — Progress Notes (Signed)
 Patient ID: Helen Martin, female   DOB: Sep 30, 1956, 68 y.o.   MRN: [id number]  New complaint: Recurrent facial swelling  HPI: The patient is a 68 year old female who presents today complaining of recurrent facial swelling.  According to the patient, she noted right facial swelling and pain in March 2025.  She was treated with antibiotic, with resolution of her symptoms.  In July 2025, she noted swelling and redness over her left ear and left face.  She was treated with doxycycline again, with resolution of her swelling and redness.  Currently she is not symptomatic.  The patient was previously seen for eczematous otitis externa.  She was treated with Elocon  cream.  Exam: General: Communicates without difficulty, well nourished, no acute distress. Head: Normocephalic, no evidence injury, no tenderness, facial buttresses intact without stepoff. Face/sinus: No tenderness to palpation and percussion. Facial movement is normal and symmetric. Eyes: PERRL, EOMI. No scleral icterus, conjunctivae clear. Neuro: CN II exam reveals vision grossly intact.  No nystagmus at any point of gaze. Ears: Auricles well formed without lesions.  Ear canals are intact without mass or lesion.  No erythema or edema is appreciated.  The TMs are intact without fluid. Nose: External evaluation reveals normal support and skin without lesions.  Dorsum is intact.  Anterior rhinoscopy reveals normal mucosa over anterior aspect of inferior turbinates and intact septum.  No purulence noted. Oral:  Oral cavity and oropharynx are intact, symmetric, without erythema or edema.  Mucosa is moist without lesions. Neck: Full range of motion without pain.  There is no significant lymphadenopathy.  No masses palpable.  Thyroid bed within normal limits to palpation.  Parotid glands and submandibular glands equal bilaterally without mass.  Trachea is midline. Neuro:  CN 2-12 grossly intact.   Assessment: 1.  Recurrent facial cellulitis. 2.  Chronic  eczematous otitis externa. 3.  The patient is currently not symptomatic.  Plan: 1.  The physical exam findings are reviewed with the patient. 2.  The patient is reassured that no abnormality is noted today. 3.  Elocon  cream as needed to treat the chronic eczematous otitis externa. 4.  The patient is encouraged to call with any questions or concerns.

## 2024-08-29 DIAGNOSIS — E1159 Type 2 diabetes mellitus with other circulatory complications: Secondary | ICD-10-CM | POA: Diagnosis not present

## 2024-08-29 DIAGNOSIS — I10 Essential (primary) hypertension: Secondary | ICD-10-CM | POA: Diagnosis not present

## 2024-09-02 ENCOUNTER — Ambulatory Visit (INDEPENDENT_AMBULATORY_CARE_PROVIDER_SITE_OTHER): Admitting: Otolaryngology

## 2024-09-02 ENCOUNTER — Encounter (INDEPENDENT_AMBULATORY_CARE_PROVIDER_SITE_OTHER): Payer: Self-pay | Admitting: Otolaryngology

## 2024-09-02 VITALS — BP 118/75 | HR 73 | Temp 97.8°F | Ht 61.0 in | Wt 185.0 lb

## 2024-09-02 DIAGNOSIS — H903 Sensorineural hearing loss, bilateral: Secondary | ICD-10-CM | POA: Diagnosis not present

## 2024-09-02 DIAGNOSIS — H608X9 Other otitis externa, unspecified ear: Secondary | ICD-10-CM | POA: Diagnosis not present

## 2024-09-02 DIAGNOSIS — H90A21 Sensorineural hearing loss, unilateral, right ear, with restricted hearing on the contralateral side: Secondary | ICD-10-CM

## 2024-09-02 DIAGNOSIS — H608X3 Other otitis externa, bilateral: Secondary | ICD-10-CM

## 2024-09-02 DIAGNOSIS — H6123 Impacted cerumen, bilateral: Secondary | ICD-10-CM

## 2024-09-02 DIAGNOSIS — L03211 Cellulitis of face: Secondary | ICD-10-CM

## 2024-09-02 NOTE — Progress Notes (Signed)
 Patient ID: Helen Martin, female   DOB: 03/22/1956, 68 y.o.   MRN: [id number]  Follow-up: Bilateral hearing loss, chronic eczematous otitis externa, recurrent facial cellulitis  Discussed the use of AI scribe software for clinical note transcription with the patient, who gave verbal consent to proceed.  History of Present Illness Helen Martin is a 68 year old female with recurrent facial cellulitis and chronic eczematous otitis externa who presents with worsening hearing loss.  She experiences worsening hearing loss despite using hearing aids in both ears. She has been wearing her current hearing aids since September 2022, and initially, they provided significant improvement. However, she feels her hearing has deteriorated since she started using them.  Her ears remain itchy, and she sometimes uses Elocon  cream for relief, which she finds helpful. No recent episodes of facial cellulitis have occurred since her last visit in August 2025.  Exam: General: Communicates without difficulty, well nourished, no acute distress. Head: Normocephalic, no evidence injury, no tenderness, facial buttresses intact without stepoff. Face/sinus: No tenderness to palpation and percussion. Facial movement is normal and symmetric. Eyes: PERRL, EOMI. No scleral icterus, conjunctivae clear. Neuro: CN II exam reveals vision grossly intact.  No nystagmus at any point of gaze. Ears: Auricles well formed without lesions.  Bilateral cerumen impaction.  Nose: External evaluation reveals normal support and skin without lesions.  Dorsum is intact.  Anterior rhinoscopy reveals congested mucosa over anterior aspect of inferior turbinates and intact septum.  No purulence noted. Oral:  Oral cavity and oropharynx are intact, symmetric, without erythema or edema.  Mucosa is moist without lesions. Neck: Full range of motion without pain.  There is no significant lymphadenopathy.  No masses palpable.  Thyroid bed within normal limits to  palpation.  Parotid glands and submandibular glands equal bilaterally without mass.  Trachea is midline. Neuro:  CN 2-12 grossly intact.   Procedure: Bilateral cerumen disimpaction Anesthesia: None Description: Under the operating microscope, the cerumen is carefully removed with a combination of cerumen currette, alligator forceps, and suction catheters.  After the cerumen is removed, the TMs are noted to be normal.  No mass, erythema, or lesions. The patient tolerated the procedure well.    Assessment and Plan Assessment & Plan Bilateral sensorineural hearing loss, worse on the right side Bilateral sensorineural hearing loss with reported worsening of hearing. She has been using hearing aids for three years, initially with good results, but now reports decreased effectiveness.  - Refer to audiologist for hearing test and evaluation of current hearing aids - Consider different hearing aids if current ones are insufficient - The patient will return for reevaluation in 6 months.  Chronic eczematous otitis externa Chronic eczematous otitis externa with persistent itchiness. She reports intermittent use of Elocon  cream, which provides relief from itching. No acute cellulitis noted during the last visit. - Continue Elocon  cream for itchiness as needed - Advise to return if cellulitis or infection occurs  Bilateral cerumen impaction. - Otomicroscopy with bilateral cerumen disimpaction.  Both tympanic membranes and middle ear spaces are noted to be normal.

## 2025-03-03 ENCOUNTER — Ambulatory Visit (INDEPENDENT_AMBULATORY_CARE_PROVIDER_SITE_OTHER): Admitting: Otolaryngology
